# Patient Record
Sex: Male | Born: 1995 | Race: White | Hispanic: No | Marital: Single
Health system: Southern US, Community
[De-identification: ages and names within clinical notes are randomized; demographics above are authoritative.]

---
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
## 2014-08-19 ENCOUNTER — Telehealth: Payer: Self-pay

## 2014-08-19 ENCOUNTER — Encounter: Payer: BC Managed Care – PPO | Admitting: Infectious Disease

## 2014-08-19 NOTE — Telephone Encounter (Signed)
Patient calling stating he fell 4 days ago by tripping on cat litter box. He has bruising and pain in upper chest area.  He is coming today for labs and would like to see Dr Van Dam if posDaiva Evessible.   Please advise , there are not open appointments for today.    Laurell Josephsammy K Giavana Rooke, RN

## 2014-08-19 NOTE — Telephone Encounter (Signed)
We will see patient today after his lab visit.   Laurell Josephsammy K Azam Gervasi, RN

## 2020-06-04 DIAGNOSIS — J069 Acute upper respiratory infection, unspecified: Secondary | ICD-10-CM | POA: Diagnosis not present

## 2020-06-04 DIAGNOSIS — Z1152 Encounter for screening for COVID-19: Secondary | ICD-10-CM | POA: Diagnosis not present

## 2020-06-05 DIAGNOSIS — M9903 Segmental and somatic dysfunction of lumbar region: Secondary | ICD-10-CM | POA: Diagnosis not present

## 2020-06-05 DIAGNOSIS — M9902 Segmental and somatic dysfunction of thoracic region: Secondary | ICD-10-CM | POA: Diagnosis not present

## 2020-06-05 DIAGNOSIS — M9901 Segmental and somatic dysfunction of cervical region: Secondary | ICD-10-CM | POA: Diagnosis not present

## 2020-06-26 DIAGNOSIS — M9901 Segmental and somatic dysfunction of cervical region: Secondary | ICD-10-CM | POA: Diagnosis not present

## 2020-06-26 DIAGNOSIS — M9903 Segmental and somatic dysfunction of lumbar region: Secondary | ICD-10-CM | POA: Diagnosis not present

## 2020-06-26 DIAGNOSIS — M9902 Segmental and somatic dysfunction of thoracic region: Secondary | ICD-10-CM | POA: Diagnosis not present

## 2020-07-24 DIAGNOSIS — M9902 Segmental and somatic dysfunction of thoracic region: Secondary | ICD-10-CM | POA: Diagnosis not present

## 2020-07-24 DIAGNOSIS — M9903 Segmental and somatic dysfunction of lumbar region: Secondary | ICD-10-CM | POA: Diagnosis not present

## 2020-07-24 DIAGNOSIS — M9901 Segmental and somatic dysfunction of cervical region: Secondary | ICD-10-CM | POA: Diagnosis not present

## 2020-08-21 DIAGNOSIS — M9902 Segmental and somatic dysfunction of thoracic region: Secondary | ICD-10-CM | POA: Diagnosis not present

## 2020-08-21 DIAGNOSIS — M9901 Segmental and somatic dysfunction of cervical region: Secondary | ICD-10-CM | POA: Diagnosis not present

## 2020-08-21 DIAGNOSIS — M9903 Segmental and somatic dysfunction of lumbar region: Secondary | ICD-10-CM | POA: Diagnosis not present

## 2020-09-04 DIAGNOSIS — M9901 Segmental and somatic dysfunction of cervical region: Secondary | ICD-10-CM | POA: Diagnosis not present

## 2020-09-04 DIAGNOSIS — M9903 Segmental and somatic dysfunction of lumbar region: Secondary | ICD-10-CM | POA: Diagnosis not present

## 2020-09-04 DIAGNOSIS — M9902 Segmental and somatic dysfunction of thoracic region: Secondary | ICD-10-CM | POA: Diagnosis not present

## 2020-09-25 DIAGNOSIS — M9901 Segmental and somatic dysfunction of cervical region: Secondary | ICD-10-CM | POA: Diagnosis not present

## 2020-09-25 DIAGNOSIS — M9902 Segmental and somatic dysfunction of thoracic region: Secondary | ICD-10-CM | POA: Diagnosis not present

## 2020-09-25 DIAGNOSIS — M9903 Segmental and somatic dysfunction of lumbar region: Secondary | ICD-10-CM | POA: Diagnosis not present

## 2020-10-16 DIAGNOSIS — M9902 Segmental and somatic dysfunction of thoracic region: Secondary | ICD-10-CM | POA: Diagnosis not present

## 2020-10-16 DIAGNOSIS — M9903 Segmental and somatic dysfunction of lumbar region: Secondary | ICD-10-CM | POA: Diagnosis not present

## 2020-10-16 DIAGNOSIS — M9901 Segmental and somatic dysfunction of cervical region: Secondary | ICD-10-CM | POA: Diagnosis not present

## 2020-10-23 DIAGNOSIS — M9902 Segmental and somatic dysfunction of thoracic region: Secondary | ICD-10-CM | POA: Diagnosis not present

## 2020-10-23 DIAGNOSIS — M9901 Segmental and somatic dysfunction of cervical region: Secondary | ICD-10-CM | POA: Diagnosis not present

## 2020-10-23 DIAGNOSIS — M9903 Segmental and somatic dysfunction of lumbar region: Secondary | ICD-10-CM | POA: Diagnosis not present

## 2020-11-06 DIAGNOSIS — M9903 Segmental and somatic dysfunction of lumbar region: Secondary | ICD-10-CM | POA: Diagnosis not present

## 2020-11-06 DIAGNOSIS — M9901 Segmental and somatic dysfunction of cervical region: Secondary | ICD-10-CM | POA: Diagnosis not present

## 2020-11-06 DIAGNOSIS — M9902 Segmental and somatic dysfunction of thoracic region: Secondary | ICD-10-CM | POA: Diagnosis not present

## 2020-11-20 DIAGNOSIS — M9902 Segmental and somatic dysfunction of thoracic region: Secondary | ICD-10-CM | POA: Diagnosis not present

## 2020-11-20 DIAGNOSIS — M9901 Segmental and somatic dysfunction of cervical region: Secondary | ICD-10-CM | POA: Diagnosis not present

## 2020-11-20 DIAGNOSIS — M9903 Segmental and somatic dysfunction of lumbar region: Secondary | ICD-10-CM | POA: Diagnosis not present

## 2020-12-04 DIAGNOSIS — M9903 Segmental and somatic dysfunction of lumbar region: Secondary | ICD-10-CM | POA: Diagnosis not present

## 2020-12-04 DIAGNOSIS — M9902 Segmental and somatic dysfunction of thoracic region: Secondary | ICD-10-CM | POA: Diagnosis not present

## 2020-12-04 DIAGNOSIS — M9901 Segmental and somatic dysfunction of cervical region: Secondary | ICD-10-CM | POA: Diagnosis not present

## 2020-12-17 DIAGNOSIS — J029 Acute pharyngitis, unspecified: Secondary | ICD-10-CM | POA: Diagnosis not present

## 2020-12-18 DIAGNOSIS — M9902 Segmental and somatic dysfunction of thoracic region: Secondary | ICD-10-CM | POA: Diagnosis not present

## 2020-12-18 DIAGNOSIS — M9901 Segmental and somatic dysfunction of cervical region: Secondary | ICD-10-CM | POA: Diagnosis not present

## 2020-12-18 DIAGNOSIS — M9903 Segmental and somatic dysfunction of lumbar region: Secondary | ICD-10-CM | POA: Diagnosis not present

## 2021-01-22 DIAGNOSIS — M9901 Segmental and somatic dysfunction of cervical region: Secondary | ICD-10-CM | POA: Diagnosis not present

## 2021-01-22 DIAGNOSIS — M9903 Segmental and somatic dysfunction of lumbar region: Secondary | ICD-10-CM | POA: Diagnosis not present

## 2021-01-22 DIAGNOSIS — M9902 Segmental and somatic dysfunction of thoracic region: Secondary | ICD-10-CM | POA: Diagnosis not present

## 2021-02-12 DIAGNOSIS — M9902 Segmental and somatic dysfunction of thoracic region: Secondary | ICD-10-CM | POA: Diagnosis not present

## 2021-02-12 DIAGNOSIS — M9903 Segmental and somatic dysfunction of lumbar region: Secondary | ICD-10-CM | POA: Diagnosis not present

## 2021-02-12 DIAGNOSIS — M9901 Segmental and somatic dysfunction of cervical region: Secondary | ICD-10-CM | POA: Diagnosis not present

## 2021-04-22 DIAGNOSIS — M9903 Segmental and somatic dysfunction of lumbar region: Secondary | ICD-10-CM | POA: Diagnosis not present

## 2021-04-22 DIAGNOSIS — M9902 Segmental and somatic dysfunction of thoracic region: Secondary | ICD-10-CM | POA: Diagnosis not present

## 2021-04-22 DIAGNOSIS — M9901 Segmental and somatic dysfunction of cervical region: Secondary | ICD-10-CM | POA: Diagnosis not present

## 2021-05-10 DIAGNOSIS — R059 Cough, unspecified: Secondary | ICD-10-CM | POA: Diagnosis not present

## 2021-05-10 DIAGNOSIS — Z20822 Contact with and (suspected) exposure to covid-19: Secondary | ICD-10-CM | POA: Diagnosis not present

## 2021-06-10 DIAGNOSIS — Z1331 Encounter for screening for depression: Secondary | ICD-10-CM | POA: Diagnosis not present

## 2021-06-10 DIAGNOSIS — Z0000 Encounter for general adult medical examination without abnormal findings: Secondary | ICD-10-CM | POA: Diagnosis not present

## 2021-08-11 DIAGNOSIS — K219 Gastro-esophageal reflux disease without esophagitis: Secondary | ICD-10-CM | POA: Diagnosis not present

## 2021-08-11 DIAGNOSIS — K589 Irritable bowel syndrome without diarrhea: Secondary | ICD-10-CM | POA: Diagnosis not present

## 2021-08-11 DIAGNOSIS — R079 Chest pain, unspecified: Secondary | ICD-10-CM | POA: Diagnosis not present

## 2021-08-11 DIAGNOSIS — R1013 Epigastric pain: Secondary | ICD-10-CM | POA: Diagnosis not present

## 2022-05-27 DIAGNOSIS — D225 Melanocytic nevi of trunk: Secondary | ICD-10-CM | POA: Diagnosis not present

## 2022-05-27 DIAGNOSIS — D2239 Melanocytic nevi of other parts of face: Secondary | ICD-10-CM | POA: Diagnosis not present

## 2022-05-27 DIAGNOSIS — L814 Other melanin hyperpigmentation: Secondary | ICD-10-CM | POA: Diagnosis not present

## 2022-08-22 DIAGNOSIS — L03113 Cellulitis of right upper limb: Secondary | ICD-10-CM | POA: Diagnosis not present

## 2022-12-14 DIAGNOSIS — R590 Localized enlarged lymph nodes: Secondary | ICD-10-CM | POA: Diagnosis not present
